# Patient Record
Sex: Male | Born: 1960 | Race: Black or African American | Hispanic: No | Marital: Single
Health system: Southern US, Community
[De-identification: ages and names within clinical notes are randomized; demographics above are authoritative.]

---

## 2014-06-23 ENCOUNTER — Emergency Department: Payer: Self-pay | Admitting: Emergency Medicine

## 2014-06-29 ENCOUNTER — Emergency Department: Payer: Self-pay | Admitting: Emergency Medicine

## 2014-06-29 LAB — SALICYLATE LEVEL: Salicylates, Serum: 1.8 mg/dL

## 2014-06-29 LAB — URINALYSIS, COMPLETE
BLOOD: NEGATIVE
Bilirubin,UR: NEGATIVE
Ketone: NEGATIVE
Leukocyte Esterase: NEGATIVE
NITRITE: NEGATIVE
Ph: 5 (ref 4.5–8.0)
Protein: >=500
Specific Gravity: 1.013 (ref 1.003–1.030)

## 2014-06-29 LAB — COMPREHENSIVE METABOLIC PANEL
ALK PHOS: 92 U/L
ALT: 43 U/L (ref 12–78)
AST: 28 U/L (ref 15–37)
Albumin: 1.8 g/dL — ABNORMAL LOW (ref 3.4–5.0)
Anion Gap: 8 (ref 7–16)
BUN: 30 mg/dL — AB (ref 7–18)
Bilirubin,Total: 0.1 mg/dL — ABNORMAL LOW (ref 0.2–1.0)
CHLORIDE: 112 mmol/L — AB (ref 98–107)
CREATININE: 2.8 mg/dL — AB (ref 0.60–1.30)
Calcium, Total: 7.6 mg/dL — ABNORMAL LOW (ref 8.5–10.1)
Co2: 23 mmol/L (ref 21–32)
EGFR (African American): 29 — ABNORMAL LOW
GFR CALC NON AF AMER: 25 — AB
Glucose: 222 mg/dL — ABNORMAL HIGH (ref 65–99)
Osmolality: 298 (ref 275–301)
Potassium: 3.6 mmol/L (ref 3.5–5.1)
Sodium: 143 mmol/L (ref 136–145)
TOTAL PROTEIN: 6.1 g/dL — AB (ref 6.4–8.2)

## 2014-06-29 LAB — DRUG SCREEN, URINE

## 2014-06-29 LAB — CBC
HCT: 34.8 % — ABNORMAL LOW (ref 40.0–52.0)
HGB: 11.6 g/dL — ABNORMAL LOW (ref 13.0–18.0)
MCH: 30.8 pg (ref 26.0–34.0)
MCHC: 33.4 g/dL (ref 32.0–36.0)
MCV: 92 fL (ref 80–100)
Platelet: 358 10*3/uL (ref 150–440)
RBC: 3.78 10*6/uL — ABNORMAL LOW (ref 4.40–5.90)
RDW: 13.5 % (ref 11.5–14.5)
WBC: 10.6 10*3/uL (ref 3.8–10.6)

## 2014-06-29 LAB — TSH: Thyroid Stimulating Horm: 0.862 u[IU]/mL

## 2014-06-29 LAB — ETHANOL: Ethanol: <3 mg/dL

## 2014-06-29 LAB — ACETAMINOPHEN LEVEL: ACETAMINOPHEN: 3 ug/mL — AB

## 2014-06-30 LAB — CARBAMAZEPINE LEVEL, TOTAL: Carbamazepine: 2.9 ug/mL — ABNORMAL LOW (ref 4.0–12.0)

## 2014-07-10 ENCOUNTER — Emergency Department: Payer: Self-pay | Admitting: Internal Medicine

## 2014-07-10 LAB — URINALYSIS, COMPLETE
BILIRUBIN, UR: NEGATIVE
Bacteria: NONE SEEN
Ketone: NEGATIVE
LEUKOCYTE ESTERASE: NEGATIVE
NITRITE: NEGATIVE
PH: 5 (ref 4.5–8.0)
Protein: >=500
RBC,UR: 3 /HPF (ref 0–5)
SPECIFIC GRAVITY: 1.016 (ref 1.003–1.030)
Squamous Epithelial: NONE SEEN
WBC UR: 3 /HPF (ref 0–5)

## 2014-07-10 LAB — CBC
HCT: 34 % — ABNORMAL LOW (ref 40.0–52.0)
HGB: 11.4 g/dL — ABNORMAL LOW (ref 13.0–18.0)
MCH: 30.9 pg (ref 26.0–34.0)
MCHC: 33.5 g/dL (ref 32.0–36.0)
MCV: 92 fL (ref 80–100)
Platelet: 467 10*3/uL — ABNORMAL HIGH (ref 150–440)
RBC: 3.69 10*6/uL — ABNORMAL LOW (ref 4.40–5.90)
RDW: 13 % (ref 11.5–14.5)
WBC: 10.1 10*3/uL (ref 3.8–10.6)

## 2014-07-10 LAB — COMPREHENSIVE METABOLIC PANEL
ALT: 40 U/L (ref 12–78)
AST: 34 U/L (ref 15–37)
Albumin: 1.9 g/dL — ABNORMAL LOW (ref 3.4–5.0)
Alkaline Phosphatase: 105 U/L
Anion Gap: 4 — ABNORMAL LOW (ref 7–16)
BUN: 35 mg/dL — ABNORMAL HIGH (ref 7–18)
Bilirubin,Total: 0.2 mg/dL (ref 0.2–1.0)
CO2: 26 mmol/L (ref 21–32)
Calcium, Total: 7.9 mg/dL — ABNORMAL LOW (ref 8.5–10.1)
Chloride: 110 mmol/L — ABNORMAL HIGH (ref 98–107)
Creatinine: 3.01 mg/dL — ABNORMAL HIGH (ref 0.60–1.30)
EGFR (African American): 26 — ABNORMAL LOW
EGFR (Non-African Amer.): 23 — ABNORMAL LOW
Glucose: 273 mg/dL — ABNORMAL HIGH (ref 65–99)
Osmolality: 297 (ref 275–301)
Potassium: 4.2 mmol/L (ref 3.5–5.1)
SODIUM: 140 mmol/L (ref 136–145)
Total Protein: 6.9 g/dL (ref 6.4–8.2)

## 2014-07-10 LAB — CARBAMAZEPINE LEVEL, TOTAL: Carbamazepine: 6.4 ug/mL (ref 4.0–12.0)

## 2014-07-10 LAB — TROPONIN I

## 2014-07-17 ENCOUNTER — Emergency Department: Payer: Self-pay | Admitting: Emergency Medicine

## 2014-07-17 LAB — COMPREHENSIVE METABOLIC PANEL
ANION GAP: 6 — AB (ref 7–16)
Albumin: 2 g/dL — ABNORMAL LOW (ref 3.4–5.0)
Alkaline Phosphatase: 96 U/L
BILIRUBIN TOTAL: 0.2 mg/dL (ref 0.2–1.0)
BUN: 44 mg/dL — ABNORMAL HIGH (ref 7–18)
CALCIUM: 7.9 mg/dL — AB (ref 8.5–10.1)
CHLORIDE: 111 mmol/L — AB (ref 98–107)
CO2: 26 mmol/L (ref 21–32)
Creatinine: 3.44 mg/dL — ABNORMAL HIGH (ref 0.60–1.30)
EGFR (Non-African Amer.): 19 — ABNORMAL LOW
GFR CALC AF AMER: 22 — AB
GLUCOSE: 216 mg/dL — AB (ref 65–99)
Osmolality: 303 (ref 275–301)
Potassium: 3.9 mmol/L (ref 3.5–5.1)
SGOT(AST): 32 U/L (ref 15–37)
SGPT (ALT): 39 U/L (ref 12–78)
SODIUM: 143 mmol/L (ref 136–145)
Total Protein: 7.2 g/dL (ref 6.4–8.2)

## 2014-07-17 LAB — URINALYSIS, COMPLETE
Bilirubin,UR: NEGATIVE
Ketone: NEGATIVE
Leukocyte Esterase: NEGATIVE
NITRITE: NEGATIVE
PH: 5 (ref 4.5–8.0)
SQUAMOUS EPITHELIAL: NONE SEEN
Specific Gravity: 1.02 (ref 1.003–1.030)

## 2014-07-17 LAB — DRUG SCREEN, URINE
AMPHETAMINES, UR SCREEN: NEGATIVE (ref ?–1000)
BARBITURATES, UR SCREEN: NEGATIVE (ref ?–200)
BENZODIAZEPINE, UR SCRN: NEGATIVE (ref ?–200)
CANNABINOID 50 NG, UR ~~LOC~~: POSITIVE (ref ?–50)
Cocaine Metabolite,Ur ~~LOC~~: NEGATIVE (ref ?–300)
MDMA (ECSTASY) UR SCREEN: NEGATIVE (ref ?–500)
METHADONE, UR SCREEN: NEGATIVE (ref ?–300)
Opiate, Ur Screen: NEGATIVE (ref ?–300)
PHENCYCLIDINE (PCP) UR S: NEGATIVE (ref ?–25)
Tricyclic, Ur Screen: NEGATIVE (ref ?–1000)

## 2014-07-17 LAB — CBC
HCT: 33.5 % — ABNORMAL LOW (ref 40.0–52.0)
HGB: 10.7 g/dL — ABNORMAL LOW (ref 13.0–18.0)
MCH: 29.9 pg (ref 26.0–34.0)
MCHC: 31.8 g/dL — ABNORMAL LOW (ref 32.0–36.0)
MCV: 94 fL (ref 80–100)
Platelet: 491 10*3/uL — ABNORMAL HIGH (ref 150–440)
RBC: 3.57 10*6/uL — ABNORMAL LOW (ref 4.40–5.90)
RDW: 13.5 % (ref 11.5–14.5)
WBC: 9.4 10*3/uL (ref 3.8–10.6)

## 2014-07-17 LAB — TROPONIN I: Troponin-I: <0.02 ng/mL

## 2014-07-17 LAB — ETHANOL

## 2014-07-17 LAB — CARBAMAZEPINE LEVEL, TOTAL: Carbamazepine: 6.2 ug/mL (ref 4.0–12.0)

## 2014-07-17 LAB — CK TOTAL AND CKMB (NOT AT ARMC)
CK, Total: 296 U/L
CK-MB: 9.8 ng/mL — AB (ref 0.5–3.6)

## 2014-07-31 ENCOUNTER — Emergency Department: Payer: Self-pay | Admitting: Emergency Medicine

## 2014-07-31 LAB — CBC
HCT: 34.5 % — AB (ref 40.0–52.0)
HGB: 11.1 g/dL — ABNORMAL LOW (ref 13.0–18.0)
MCH: 29.9 pg (ref 26.0–34.0)
MCHC: 32.1 g/dL (ref 32.0–36.0)
MCV: 93 fL (ref 80–100)
PLATELETS: 355 10*3/uL (ref 150–440)
RBC: 3.71 10*6/uL — ABNORMAL LOW (ref 4.40–5.90)
RDW: 13.4 % (ref 11.5–14.5)
WBC: 8.5 10*3/uL (ref 3.8–10.6)

## 2014-07-31 LAB — ETHANOL: Ethanol %: <0.003 % (ref 0.000–0.080)

## 2014-07-31 LAB — COMPREHENSIVE METABOLIC PANEL
ALBUMIN: 2.2 g/dL — AB (ref 3.4–5.0)
ALT: 42 U/L
Alkaline Phosphatase: 101 U/L
Anion Gap: 3 — ABNORMAL LOW (ref 7–16)
BILIRUBIN TOTAL: 0.2 mg/dL (ref 0.2–1.0)
BUN: 29 mg/dL — ABNORMAL HIGH (ref 7–18)
CO2: 25 mmol/L (ref 21–32)
Calcium, Total: 7.3 mg/dL — ABNORMAL LOW (ref 8.5–10.1)
Chloride: 111 mmol/L — ABNORMAL HIGH (ref 98–107)
Creatinine: 2.99 mg/dL — ABNORMAL HIGH (ref 0.60–1.30)
EGFR (African American): 26 — ABNORMAL LOW
EGFR (Non-African Amer.): 23 — ABNORMAL LOW
GLUCOSE: 302 mg/dL — AB (ref 65–99)
OSMOLALITY: 295 (ref 275–301)
POTASSIUM: 3.6 mmol/L (ref 3.5–5.1)
SGOT(AST): 38 U/L — ABNORMAL HIGH (ref 15–37)
SODIUM: 139 mmol/L (ref 136–145)
TOTAL PROTEIN: 7.1 g/dL (ref 6.4–8.2)

## 2014-07-31 LAB — ACETAMINOPHEN LEVEL

## 2014-07-31 LAB — SALICYLATE LEVEL: SALICYLATES, SERUM: 1.9 mg/dL

## 2014-08-10 ENCOUNTER — Emergency Department: Payer: Self-pay | Admitting: Emergency Medicine

## 2014-08-10 LAB — CBC WITH DIFFERENTIAL/PLATELET
Basophil #: 0.1 10*3/uL (ref 0.0–0.1)
Basophil %: 0.5 %
Eosinophil #: 0.3 10*3/uL (ref 0.0–0.7)
Eosinophil %: 2.7 %
HCT: 36.4 % — ABNORMAL LOW (ref 40.0–52.0)
HGB: 11.6 g/dL — AB (ref 13.0–18.0)
Lymphocyte #: 1.8 10*3/uL (ref 1.0–3.6)
Lymphocyte %: 15.4 %
MCH: 29.8 pg (ref 26.0–34.0)
MCHC: 31.8 g/dL — AB (ref 32.0–36.0)
MCV: 94 fL (ref 80–100)
Monocyte #: 1 x10 3/mm (ref 0.2–1.0)
Monocyte %: 8.1 %
NEUTROS PCT: 73.3 %
Neutrophil #: 8.7 10*3/uL — ABNORMAL HIGH (ref 1.4–6.5)
Platelet: 346 10*3/uL (ref 150–440)
RBC: 3.87 10*6/uL — AB (ref 4.40–5.90)
RDW: 14 % (ref 11.5–14.5)
WBC: 11.8 10*3/uL — ABNORMAL HIGH (ref 3.8–10.6)

## 2014-08-10 LAB — BASIC METABOLIC PANEL
Anion Gap: 6 — ABNORMAL LOW (ref 7–16)
BUN: 49 mg/dL — AB (ref 7–18)
CHLORIDE: 112 mmol/L — AB (ref 98–107)
CO2: 25 mmol/L (ref 21–32)
Calcium, Total: 8 mg/dL — ABNORMAL LOW (ref 8.5–10.1)
Creatinine: 3.39 mg/dL — ABNORMAL HIGH (ref 0.60–1.30)
EGFR (African American): 23 — ABNORMAL LOW
GFR CALC NON AF AMER: 20 — AB
GLUCOSE: 78 mg/dL (ref 65–99)
OSMOLALITY: 297 (ref 275–301)
Potassium: 4.3 mmol/L (ref 3.5–5.1)
Sodium: 143 mmol/L (ref 136–145)

## 2014-08-10 LAB — URINALYSIS, COMPLETE
Bilirubin,UR: NEGATIVE
GLUCOSE, UR: NEGATIVE mg/dL (ref 0–75)
Ketone: NEGATIVE
Leukocyte Esterase: NEGATIVE
Nitrite: NEGATIVE
PH: 5 (ref 4.5–8.0)
RBC,UR: 335 /HPF (ref 0–5)
SQUAMOUS EPITHELIAL: NONE SEEN
Specific Gravity: 1.012 (ref 1.003–1.030)
WBC UR: 9 /HPF (ref 0–5)

## 2014-08-20 ENCOUNTER — Emergency Department: Payer: Self-pay | Admitting: Emergency Medicine

## 2014-08-20 LAB — CBC
HCT: 32.3 % — ABNORMAL LOW (ref 40.0–52.0)
HGB: 10.7 g/dL — ABNORMAL LOW (ref 13.0–18.0)
MCH: 30.7 pg (ref 26.0–34.0)
MCHC: 33.1 g/dL (ref 32.0–36.0)
MCV: 93 fL (ref 80–100)
Platelet: 371 10*3/uL (ref 150–440)
RBC: 3.48 10*6/uL — AB (ref 4.40–5.90)
RDW: 13.8 % (ref 11.5–14.5)
WBC: 10.5 10*3/uL (ref 3.8–10.6)

## 2014-08-20 LAB — COMPREHENSIVE METABOLIC PANEL
ALBUMIN: 1.9 g/dL — AB (ref 3.4–5.0)
ALK PHOS: 82 U/L
AST: 41 U/L — AB (ref 15–37)
Anion Gap: 10 (ref 7–16)
BILIRUBIN TOTAL: 0.3 mg/dL (ref 0.2–1.0)
BUN: 38 mg/dL — AB (ref 7–18)
CREATININE: 2.88 mg/dL — AB (ref 0.60–1.30)
Calcium, Total: 7.4 mg/dL — ABNORMAL LOW (ref 8.5–10.1)
Chloride: 112 mmol/L — ABNORMAL HIGH (ref 98–107)
Co2: 24 mmol/L (ref 21–32)
EGFR (African American): 28 — ABNORMAL LOW
EGFR (Non-African Amer.): 24 — ABNORMAL LOW
Glucose: 118 mg/dL — ABNORMAL HIGH (ref 65–99)
Osmolality: 301 (ref 275–301)
POTASSIUM: 4.6 mmol/L (ref 3.5–5.1)
SGPT (ALT): 28 U/L
Sodium: 146 mmol/L — ABNORMAL HIGH (ref 136–145)
Total Protein: 6.7 g/dL (ref 6.4–8.2)

## 2014-08-20 LAB — LIPASE, BLOOD: Lipase: 230 U/L (ref 73–393)

## 2014-08-20 LAB — URINALYSIS, COMPLETE
Bacteria: NONE SEEN
Bilirubin,UR: NEGATIVE
Glucose,UR: 50 mg/dL (ref 0–75)
Ketone: NEGATIVE
LEUKOCYTE ESTERASE: NEGATIVE
NITRITE: NEGATIVE
Ph: 7 (ref 4.5–8.0)
Protein: 100
RBC,UR: 75 /HPF (ref 0–5)
SPECIFIC GRAVITY: 1.014 (ref 1.003–1.030)
Squamous Epithelial: NONE SEEN
WBC UR: <1 /HPF (ref 0–5)
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
## 2015-04-20 NOTE — Consult Note (Signed)
Brief Consult Note: Diagnosis: Antisocial personality disorder.   Patient was seen by consultant.   Consult note dictated.   Recommend further assessment or treatment.   Orders entered.   Comments: Mr. Cameron Irwin has no psychiatric past. He cut his arm trying to realease puss from a swollen aem. He believes that the puss came out and hi is fine now. Ready to return to his group home.  PLAN: 1. The patient no longer meets criteria for IVC. I will terminate proceeding. Please discharge as appropriate.  2. Will start low dose haldol. Rx provided.  3. He is to continue all other medications as in the community.  4. He will follow up with his regular providers.  Electronic Signatures: Kristine LineaPucilowska, Acy Orsak (MD)  (Signed 06-Jul-15 16:57)  Authored: Brief Consult Note   Last Updated: 06-Jul-15 16:57 by Kristine LineaPucilowska, Giuseppina Quinones (MD)
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
## 2016-09-27 DEATH — deceased
